# Patient Record
Sex: Female | Born: 1950 | Race: White | Hispanic: No | Smoking: Never smoker
Health system: Southern US, Community
[De-identification: ages and names within clinical notes are randomized; demographics above are authoritative.]

## PROBLEM LIST (undated history)

## (undated) DIAGNOSIS — C801 Malignant (primary) neoplasm, unspecified: Secondary | ICD-10-CM

## (undated) DIAGNOSIS — I10 Essential (primary) hypertension: Secondary | ICD-10-CM

## (undated) HISTORY — PX: QUADRICEPS TENDON REPAIR: SHX756

## (undated) HISTORY — PX: PARTIAL KNEE ARTHROPLASTY: SHX2174

## (undated) HISTORY — PX: ABDOMINAL HYSTERECTOMY: SHX81

---
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
## 1978-09-13 HISTORY — PX: AUGMENTATION MAMMAPLASTY: SUR837

## 2012-09-25 DIAGNOSIS — R928 Other abnormal and inconclusive findings on diagnostic imaging of breast: Secondary | ICD-10-CM | POA: Insufficient documentation

## 2015-08-18 DIAGNOSIS — Z808 Family history of malignant neoplasm of other organs or systems: Secondary | ICD-10-CM | POA: Insufficient documentation

## 2015-09-10 ENCOUNTER — Other Ambulatory Visit: Payer: Self-pay | Admitting: Family Medicine

## 2015-09-10 ENCOUNTER — Inpatient Hospital Stay
Admission: RE | Admit: 2015-09-10 | Discharge: 2015-09-10 | Disposition: A | Discharge status: Home / Self Care | Payer: Self-pay | Source: Ambulatory Visit | Attending: *Deleted | Admitting: *Deleted

## 2015-09-10 ENCOUNTER — Other Ambulatory Visit: Payer: Self-pay | Admitting: *Deleted

## 2015-09-10 DIAGNOSIS — Z1231 Encounter for screening mammogram for malignant neoplasm of breast: Secondary | ICD-10-CM

## 2015-09-10 DIAGNOSIS — Z9289 Personal history of other medical treatment: Secondary | ICD-10-CM

## 2015-09-12 ENCOUNTER — Other Ambulatory Visit: Payer: Self-pay | Admitting: Family Medicine

## 2015-09-12 ENCOUNTER — Ambulatory Visit
Admission: RE | Admit: 2015-09-12 | Discharge: 2015-09-12 | Disposition: A | Discharge status: Home / Self Care | Payer: Medicare Other | Source: Ambulatory Visit | Attending: Family Medicine | Admitting: Family Medicine

## 2015-09-12 DIAGNOSIS — Z1231 Encounter for screening mammogram for malignant neoplasm of breast: Secondary | ICD-10-CM | POA: Diagnosis present

## 2015-09-17 DIAGNOSIS — M545 Low back pain, unspecified: Secondary | ICD-10-CM | POA: Insufficient documentation

## 2016-08-30 ENCOUNTER — Other Ambulatory Visit: Payer: Self-pay | Admitting: Family Medicine

## 2016-08-30 DIAGNOSIS — Z780 Asymptomatic menopausal state: Secondary | ICD-10-CM

## 2016-08-30 DIAGNOSIS — Z1239 Encounter for other screening for malignant neoplasm of breast: Secondary | ICD-10-CM

## 2016-09-20 ENCOUNTER — Other Ambulatory Visit: Payer: Self-pay | Admitting: Family Medicine

## 2016-09-20 ENCOUNTER — Ambulatory Visit
Admission: RE | Admit: 2016-09-20 | Discharge: 2016-09-20 | Disposition: A | Discharge status: Home / Self Care | Payer: Medicare Other | Source: Ambulatory Visit | Attending: Family Medicine | Admitting: Family Medicine

## 2016-09-20 DIAGNOSIS — Z780 Asymptomatic menopausal state: Secondary | ICD-10-CM | POA: Insufficient documentation

## 2016-09-20 DIAGNOSIS — Z1239 Encounter for other screening for malignant neoplasm of breast: Secondary | ICD-10-CM

## 2016-09-20 DIAGNOSIS — Z1231 Encounter for screening mammogram for malignant neoplasm of breast: Secondary | ICD-10-CM | POA: Insufficient documentation

## 2016-09-20 HISTORY — DX: Malignant (primary) neoplasm, unspecified: C80.1

## 2016-11-22 DIAGNOSIS — M1711 Unilateral primary osteoarthritis, right knee: Secondary | ICD-10-CM | POA: Insufficient documentation

## 2017-09-19 ENCOUNTER — Other Ambulatory Visit: Payer: Self-pay | Admitting: Family Medicine

## 2017-09-19 DIAGNOSIS — Z1239 Encounter for other screening for malignant neoplasm of breast: Secondary | ICD-10-CM

## 2017-10-11 DIAGNOSIS — M5416 Radiculopathy, lumbar region: Secondary | ICD-10-CM | POA: Insufficient documentation

## 2017-10-25 ENCOUNTER — Ambulatory Visit: Payer: Medicare Other

## 2017-10-26 ENCOUNTER — Encounter (INDEPENDENT_AMBULATORY_CARE_PROVIDER_SITE_OTHER): Payer: Self-pay

## 2017-10-26 ENCOUNTER — Ambulatory Visit
Admission: RE | Admit: 2017-10-26 | Discharge: 2017-10-26 | Disposition: A | Discharge status: Home / Self Care | Payer: Medicare Other | Source: Ambulatory Visit | Attending: Family Medicine | Admitting: Family Medicine

## 2017-10-26 DIAGNOSIS — Z1239 Encounter for other screening for malignant neoplasm of breast: Secondary | ICD-10-CM

## 2017-10-26 DIAGNOSIS — Z1231 Encounter for screening mammogram for malignant neoplasm of breast: Secondary | ICD-10-CM | POA: Insufficient documentation

## 2018-03-13 ENCOUNTER — Other Ambulatory Visit: Payer: Self-pay

## 2018-03-13 ENCOUNTER — Encounter: Payer: Self-pay | Admitting: Emergency Medicine

## 2018-03-13 ENCOUNTER — Ambulatory Visit
Admission: EM | Admit: 2018-03-13 | Discharge: 2018-03-13 | Disposition: A | Discharge status: Home / Self Care | Payer: Medicare Other | Attending: Family Medicine | Admitting: Family Medicine

## 2018-03-13 DIAGNOSIS — Z90710 Acquired absence of both cervix and uterus: Secondary | ICD-10-CM | POA: Insufficient documentation

## 2018-03-13 DIAGNOSIS — I10 Essential (primary) hypertension: Secondary | ICD-10-CM | POA: Diagnosis not present

## 2018-03-13 DIAGNOSIS — J029 Acute pharyngitis, unspecified: Secondary | ICD-10-CM

## 2018-03-13 DIAGNOSIS — Z85828 Personal history of other malignant neoplasm of skin: Secondary | ICD-10-CM | POA: Diagnosis not present

## 2018-03-13 HISTORY — DX: Essential (primary) hypertension: I10

## 2018-03-13 LAB — RAPID STREP SCREEN (MED CTR MEBANE ONLY): Streptococcus, Group A Screen (Direct): NEGATIVE

## 2018-03-13 MED ORDER — AMOXICILLIN 500 MG PO CAPS: 500.0000 mg | ORAL_CAPSULE | Freq: Two times a day (BID) | ORAL | 0 refills | Status: AC

## 2018-03-13 MED ORDER — ACETAMINOPHEN 500 MG PO TABS: 500.0000 mg | ORAL_TABLET | Freq: Once | ORAL | Status: DC

## 2018-03-13 MED ORDER — ACETAMINOPHEN 500 MG PO TABS
1000.0000 mg | ORAL_TABLET | Freq: Once | ORAL | Status: AC
Start: 2018-03-13 — End: 2018-03-13
  Administered 2018-03-13: 1000 mg via ORAL

## 2018-03-13 NOTE — ED Triage Notes (Signed)
Patient c/o sore throat, HAs, chills, fever and bodyaches for the past 4 days.

## 2018-03-13 NOTE — Discharge Instructions (Signed)
Take medication as prescribed. Rest. Drink plenty of fluids.  ° °Follow up with your primary care physician this week as needed. Return to Urgent care for new or worsening concerns.  ° °

## 2018-03-13 NOTE — ED Provider Notes (Signed)
[facility] ____________________________________________  Time seen: Approximately 7:25 PM  I have reviewed the triage vital signs and the nursing notes.   HISTORY  Chief Complaint Sore Throat   HPI Emma Merritt is a 67 y.o. female presenting for evaluation of fever, chills, body aches and sore throat present for the last 3 to 4 days.  States initially chills and body aches with followed sore throat.  Denies cough or congestion.  Denies rash.  States sore throat has been moderate and still moderate currently.  Reports T-max of 102.5 which was this morning and she took Tylenol.  No Tylenol since early this morning.  Denies known sick contacts.  Reports that she has had some tick bites recently but denies any persisting rash or complaints from tick bites.  States low back knee and leg aches accompanying fevers.  Continues to eat and drink well.  Denies other aggravating or alleviating factors.  Reports otherwise feels well. Denies recent sickness. Denies recent antibiotic use.   Clarisse Gouge, MD: PCP   Past Medical History:  Diagnosis Date  . Cancer ([place])    skin ca  . Hypertension     There are no active problems to display for this patient.   Past Surgical History:  Procedure Laterality Date  . ABDOMINAL HYSTERECTOMY    . AUGMENTATION MAMMAPLASTY Bilateral 1980   breast implants     No current facility-administered medications for this encounter.   Current Outpatient Medications:  .  amoxicillin (AMOXIL) 500 MG capsule, Take 1 capsule (500 mg total) by mouth 2 (two) times daily for 10 days., Disp: 20 capsule, Rfl: 0 .  irbesartan (AVAPRO) 300 MG tablet, Take by mouth., Disp: , Rfl:   Allergies Patient has no known allergies.  Family History  Problem Relation Age of Onset  . Breast cancer Neg Hx     Social History Social History   Tobacco Use  . Smoking status: Never Smoker  . Smokeless tobacco: Never Used  Substance Use Topics  . Alcohol use:  Yes  . Drug use: Not on file    Review of Systems Constitutional: positive fever. Eyes: No visual changes. ENT: positive sore throat. Cardiovascular: Denies chest pain. Respiratory: Denies shortness of breath. Gastrointestinal: No abdominal pain.   Musculoskeletal: Negative for back pain. Skin: Negative for rash.  ____________________________________________   PHYSICAL EXAM:  VITAL SIGNS: ED Triage Vitals  Enc Vitals Group     BP 03/13/18 1756 (!) 142/77     Pulse Rate 03/13/18 1756 87     Resp 03/13/18 1756 14     Temp 03/13/18 1756 (!) 101.7 F (38.7 C)     Temp Source 03/13/18 1756 Oral     SpO2 03/13/18 1756 97 %     Weight 03/13/18 1753 145 lb (65.8 kg)     Height 03/13/18 1753 5' 8.5" (1.74 m)     Head Circumference --      Peak Flow --      Pain Score 03/13/18 1753 5     Pain Loc --      Pain Edu? --      Excl. in [place]? --     Constitutional: Alert and oriented. Well appearing and in no acute distress. Eyes: Conjunctivae are normal.  Head: Atraumatic. No sinus tenderness to palpation. No swelling. No erythema.  Ears: no erythema, normal TMs bilaterally.   Nose:No nasal congestion  Mouth/Throat: Mucous membranes are moist. Mild to moderate pharyngeal erythema.  Mild bilateral tonsillar swelling  with bilateral exudate.  No uvular shift or deviation noted. Neck: No stridor.  No cervical spine tenderness to palpation. Hematological/Lymphatic/Immunilogical: Anterior bilateral cervical lymphadenopathy. Cardiovascular: Normal rate, regular rhythm. Grossly normal heart sounds.  Good peripheral circulation. Respiratory: Normal respiratory effort.  No retractions. No wheezes, rales or rhonchi. Good air movement.  Musculoskeletal: Ambulatory with steady gait. No cervical, thoracic or lumbar tenderness to palpation. Neurologic:  Normal speech and language. No gait instability. Skin:  Skin appears warm, dry and intact. No rash noted. Psychiatric: Mood and affect are  normal. Speech and behavior are normal.   ___________________________________________   LABS (all labs ordered are listed, but only abnormal results are displayed)  Labs Reviewed  RAPID STREP SCREEN ([facility] ONLY)  CULTURE, GROUP A STREP [facility])   ____________________________________________  PROCEDURES Procedures   . INITIAL IMPRESSION / ASSESSMENT AND PLAN / ED COURSE  Pertinent labs & imaging results that were available during my care of the patient were reviewed by me and considered in my medical decision making (see chart for details).  Well-appearing patient.  A quick strep negative, will culture.  However suspect streptococcal pharyngitis.  Will empirically treat with oral amoxicillin and await strep culture.  Encourage rest, fluids, supportive care, Tylenol, ibuprofen and warm salt water rinses. Discussed indication, risks and benefits of medications with patient.  Discussed follow up with Primary care physician this week. Discussed follow up and return parameters including no resolution or any worsening concerns. Patient verbalized understanding and agreed to plan.   ____________________________________________   FINAL CLINICAL IMPRESSION(S) / ED DIAGNOSES  Final diagnoses:  Pharyngitis, unspecified etiology     ED Discharge Orders        Ordered    amoxicillin (AMOXIL) 500 MG capsule  2 times daily     03/13/18 1858       Note: This dictation was prepared with Dragon dictation along with smaller phrase technology. Any transcriptional errors that result from this process are unintentional.         Marylene Land, NP 03/13/18 1929

## 2018-03-16 LAB — CULTURE, GROUP A STREP (THRC)

## 2019-03-13 DIAGNOSIS — S76109A Unspecified injury of unspecified quadriceps muscle, fascia and tendon, initial encounter: Secondary | ICD-10-CM | POA: Insufficient documentation

## 2019-05-01 DIAGNOSIS — N952 Postmenopausal atrophic vaginitis: Secondary | ICD-10-CM | POA: Insufficient documentation

## 2019-05-01 DIAGNOSIS — R87810 Cervical high risk human papillomavirus (HPV) DNA test positive: Secondary | ICD-10-CM | POA: Insufficient documentation

## 2019-05-01 DIAGNOSIS — R87612 Low grade squamous intraepithelial lesion on cytologic smear of cervix (LGSIL): Secondary | ICD-10-CM | POA: Insufficient documentation

## 2019-07-03 ENCOUNTER — Other Ambulatory Visit: Payer: Self-pay | Admitting: Family Medicine

## 2019-07-03 DIAGNOSIS — Z1231 Encounter for screening mammogram for malignant neoplasm of breast: Secondary | ICD-10-CM

## 2019-09-20 ENCOUNTER — Other Ambulatory Visit: Payer: Self-pay

## 2019-09-20 ENCOUNTER — Ambulatory Visit
Admission: RE | Admit: 2019-09-20 | Discharge: 2019-09-20 | Disposition: A | Discharge status: Home / Self Care | Payer: Medicare Other | Source: Ambulatory Visit | Attending: Family Medicine | Admitting: Family Medicine

## 2019-09-20 ENCOUNTER — Encounter (INDEPENDENT_AMBULATORY_CARE_PROVIDER_SITE_OTHER): Payer: Self-pay

## 2019-09-20 DIAGNOSIS — Z1231 Encounter for screening mammogram for malignant neoplasm of breast: Secondary | ICD-10-CM | POA: Diagnosis not present

## 2019-09-24 ENCOUNTER — Other Ambulatory Visit: Payer: Self-pay | Admitting: Family Medicine

## 2019-09-24 DIAGNOSIS — R928 Other abnormal and inconclusive findings on diagnostic imaging of breast: Secondary | ICD-10-CM

## 2019-10-02 ENCOUNTER — Ambulatory Visit
Admission: RE | Admit: 2019-10-02 | Discharge: 2019-10-02 | Disposition: A | Discharge status: Home / Self Care | Payer: Medicare Other | Source: Ambulatory Visit | Attending: Family Medicine | Admitting: Family Medicine

## 2019-10-02 DIAGNOSIS — R928 Other abnormal and inconclusive findings on diagnostic imaging of breast: Secondary | ICD-10-CM

## 2019-10-08 DIAGNOSIS — B001 Herpesviral vesicular dermatitis: Secondary | ICD-10-CM | POA: Insufficient documentation

## 2019-10-08 DIAGNOSIS — G5793 Unspecified mononeuropathy of bilateral lower limbs: Secondary | ICD-10-CM | POA: Insufficient documentation

## 2019-10-08 DIAGNOSIS — F5104 Psychophysiologic insomnia: Secondary | ICD-10-CM | POA: Insufficient documentation

## 2019-10-08 DIAGNOSIS — R251 Tremor, unspecified: Secondary | ICD-10-CM | POA: Insufficient documentation

## 2020-04-07 DIAGNOSIS — E538 Deficiency of other specified B group vitamins: Secondary | ICD-10-CM | POA: Insufficient documentation

## 2020-05-14 DIAGNOSIS — E7800 Pure hypercholesterolemia, unspecified: Secondary | ICD-10-CM | POA: Insufficient documentation

## 2020-05-14 DIAGNOSIS — F419 Anxiety disorder, unspecified: Secondary | ICD-10-CM | POA: Insufficient documentation

## 2020-07-08 DIAGNOSIS — M75121 Complete rotator cuff tear or rupture of right shoulder, not specified as traumatic: Secondary | ICD-10-CM | POA: Insufficient documentation

## 2020-09-12 HISTORY — PX: TOTAL KNEE ARTHROPLASTY: SHX125

## 2020-10-24 ENCOUNTER — Other Ambulatory Visit: Payer: Self-pay | Admitting: Family Medicine

## 2020-10-24 DIAGNOSIS — Z1231 Encounter for screening mammogram for malignant neoplasm of breast: Secondary | ICD-10-CM

## 2020-10-27 DIAGNOSIS — S80811A Abrasion, right lower leg, initial encounter: Secondary | ICD-10-CM | POA: Insufficient documentation

## 2020-11-14 ENCOUNTER — Other Ambulatory Visit: Payer: Self-pay

## 2020-11-14 ENCOUNTER — Ambulatory Visit
Admission: RE | Admit: 2020-11-14 | Discharge: 2020-11-14 | Disposition: A | Discharge status: Home / Self Care | Payer: Medicare Other | Source: Ambulatory Visit | Attending: Family Medicine | Admitting: Family Medicine

## 2020-11-14 DIAGNOSIS — Z1231 Encounter for screening mammogram for malignant neoplasm of breast: Secondary | ICD-10-CM | POA: Insufficient documentation

## 2020-11-30 ENCOUNTER — Other Ambulatory Visit: Payer: Self-pay

## 2020-11-30 ENCOUNTER — Ambulatory Visit
Admission: EM | Admit: 2020-11-30 | Discharge: 2020-11-30 | Disposition: A | Discharge status: Home / Self Care | Payer: Medicare Other | Attending: Sports Medicine | Admitting: Sports Medicine

## 2020-11-30 DIAGNOSIS — S9032XA Contusion of left foot, initial encounter: Secondary | ICD-10-CM

## 2020-11-30 DIAGNOSIS — M79672 Pain in left foot: Secondary | ICD-10-CM | POA: Diagnosis not present

## 2020-11-30 DIAGNOSIS — L03119 Cellulitis of unspecified part of limb: Secondary | ICD-10-CM

## 2020-11-30 MED ORDER — CEPHALEXIN 500 MG PO CAPS: 500.0000 mg | ORAL_CAPSULE | Freq: Four times a day (QID) | ORAL | 0 refills | Status: DC

## 2020-11-30 NOTE — Discharge Instructions (Signed)
Your exam is consistent with a foot contusion with a hematoma.  There is a possibility of infection so I will cover you with Keflex 4 times a day for a week. I provided educational handouts.  I want you to heat the area 20 minutes every 2 hours while awake to help thin any potential hematoma and congealed blood. If your symptoms were to worsen in any way, please see your primary care provider or go to the emergency room. These hematomas can sometimes persist for several months or longer. Over-the-counter meds as needed, Tylenol or Motrin for any discomfort. Follow-up here as needed.

## 2020-11-30 NOTE — ED Triage Notes (Addendum)
Pt states she injured her foot 2 weeks ago when bumping it into a chair. Went to [facility] and had xrays and nothing broken. Still with red swollen area over top left foot. No pain unless pressure and then it "feels like its on fire."

## 2020-12-05 NOTE — ED Provider Notes (Signed)
[facility]    CSN: [id number] Arrival date & time: 11/30/20  5053      History   Chief Complaint Chief Complaint  Patient presents with  . Foot Injury    HPI Emma Merritt is a 70 y.o. female.   Patient is a pleasant 70 year old female who presents for evaluation of the above issues.  Patient reports injuring her left foot on 11/17/2020.  She hit the lateral aspect of her foot into a chair at home.  She got swelling and ecchymosis.  Was seen at [facility] in [place] and x-rays were done that were negative.  Ordered care.  She has been soaking in Epson salts.  No fever shakes chills.  He does say that it burns and is concerned that she may be developing an infection.  She comes in today for an initial urgent care evaluation.  No red flag signs or symptoms elicited on history.     Past Medical History:  Diagnosis Date  . Cancer ([place])    skin ca  . Hypertension     There are no problems to display for this patient.   Past Surgical History:  Procedure Laterality Date  . ABDOMINAL HYSTERECTOMY    . AUGMENTATION MAMMAPLASTY Bilateral 1980   breast implants    OB History   No obstetric history on file.      Home Medications    Prior to Admission medications   Medication Sig Start Date End Date Taking? Authorizing Provider  cephALEXin (KEFLEX) 500 MG capsule Take 1 capsule (500 mg total) by mouth 4 (four) times daily. 11/30/20  Yes Verda Cumins, MD  hydrochlorothiazide (MICROZIDE) 12.5 MG capsule hydrochlorothiazide 12.5 mg caps    [provider]  irbesartan (AVAPRO) 300 MG tablet Take by mouth.    [provider]    Family History Family History  Problem Relation Age of Onset  . Breast cancer Neg Hx     Social History Social History   Tobacco Use  . Smoking status: Never Smoker  . Smokeless tobacco: Never Used  Vaping Use  . Vaping Use: Never used  Substance Use Topics  . Alcohol use: Yes    Comment: social  . Drug  use: Not Currently     Allergies   Morphine   Review of Systems Review of Systems  Constitutional: Positive for activity change. Negative for appetite change, chills, diaphoresis, fatigue and fever.  HENT: Negative.  Negative for congestion, ear pain, sinus pain and sore throat.   Eyes: Negative.  Negative for pain.  Respiratory: Negative.  Negative for cough, choking, chest tightness, shortness of breath, wheezing and stridor.   Cardiovascular: Negative.  Negative for chest pain and palpitations.  Gastrointestinal: Negative for abdominal pain, constipation, diarrhea, nausea and vomiting.  Genitourinary: Negative.  Negative for dysuria.  Musculoskeletal: Positive for arthralgias and gait problem. Negative for back pain, myalgias and neck pain.  Skin: Positive for color change.  Neurological: Negative for dizziness, light-headedness and headaches.  Hematological: Bruises/bleeds easily.  All other systems reviewed and are negative.    Physical Exam Triage Vital Signs ED Triage Vitals  Enc Vitals Group     BP 11/30/20 0935 132/78     Pulse Rate 11/30/20 0935 86     Resp 11/30/20 0935 15     Temp 11/30/20 0935 98 F (36.7 C)     Temp Source 11/30/20 0935 Oral     SpO2 11/30/20 0935 100 %     Weight 11/30/20 0933  145 lb (65.8 kg)     Height 11/30/20 0933 5\' 8"  (1.727 m)     Head Circumference --      Peak Flow --      Pain Score 11/30/20 0932 0     Pain Loc --      Pain Edu? --      Excl. in [place]? --    No data found.  Updated Vital Signs BP 132/78 (BP Location: Left Arm)   Pulse 86   Temp 98 F (36.7 C) (Oral)   Resp 15   Ht 5\' 8"  (1.727 m)   Wt 65.8 kg   SpO2 100%   BMI 22.05 kg/m   Visual Acuity Right Eye Distance:   Left Eye Distance:   Bilateral Distance:    Right Eye Near:   Left Eye Near:    Bilateral Near:     Physical Exam Vitals and nursing note reviewed.  Constitutional:      General: She is not in acute distress.    Appearance: Normal  appearance. She is not ill-appearing, toxic-appearing or diaphoretic.  HENT:     Head: Normocephalic and atraumatic.  Eyes:     Pupils: Pupils are equal, round, and reactive to light.  Cardiovascular:     Rate and Rhythm: Normal rate and regular rhythm.     Pulses: Normal pulses.     Heart sounds: Normal heart sounds.  Pulmonary:     Effort: Pulmonary effort is normal.     Breath sounds: Normal breath sounds.  Musculoskeletal:     Cervical back: Normal range of motion and neck supple.  Skin:    General: Skin is warm.     Capillary Refill: Capillary refill takes less than 2 seconds.     Findings: Bruising and erythema present.     Comments: Soft tissue swelling is noted over the lateral aspect of the foot.  There is associated erythema and warmth.  Potential infection present.  Consistent with cellulitis versus congealed blood from a significant hematoma.  She is tender to palpation.  There is no midfoot instability.  She has good active range of motion.  No evidence of any tendon retraction.  Remainder of foot and ankle exam is within normal limits.  Neurological:     General: No focal deficit present.     Mental Status: She is alert and oriented to person, place, and time.     Sensory: No sensory deficit.     Gait: Gait abnormal (mild limp).      UC Treatments / Results  Labs (all labs ordered are listed, but only abnormal results are displayed) Labs Reviewed - No data to display  EKG   Radiology No results found.  Procedures Procedures (including critical care time)  Medications Ordered in UC Medications - No data to display  Initial Impression / Assessment and Plan / UC Course  I have reviewed the triage vital signs and the nursing notes.  Pertinent labs & imaging results that were available during my care of the patient were reviewed by me and considered in my medical decision making (see chart for details).  Clinical impression: Contusion to the left lateral  aspect of the foot.  She has persistent pain.  X-rays done in orthopedics were negative for fracture.  Seems consistent with a hematoma with congealed blood.  Can fully rule out some mild cellulitis.  We will treat accordingly.  Treatment plan: 1.  Findings and treatment plan were discussed in detail with  the patient.  Patient was in agreement. 2.  I felt that this was probably just more congealed blood from the hematoma and may take some time.  That said it was not reasonable given the fact that she does have some warmth and erythema to treat her with an antibiotic for mild cellulitis.  Prescribed Keflex 4 times daily for a week. 3.  Recommended that she put hot compresses on that area and to try to thin some of the blood in the lower body did not take care of it.  Certainly if the redness and warmth was to worsen with that I want her to discontinue that. 4.  Educational handouts were provided. 5.  If symptoms do not resolve I want her to see her primary care provider or come here.  Certainly if they worsen then I want her to go to the ER. 6.  Supportive care, over-the-counter meds as needed, Tylenol or Motrin for any fever or discomfort. 7.  Follow-up as needed.    Final Clinical Impressions(s) / UC Diagnoses   Final diagnoses:  Contusion of left foot, initial encounter  Foot pain, left  Hematoma of left foot  Cellulitis of foot     Discharge Instructions     Your exam is consistent with a foot contusion with a hematoma.  There is a possibility of infection so I will cover you with Keflex 4 times a day for a week. I provided educational handouts.  I want you to heat the area 20 minutes every 2 hours while awake to help thin any potential hematoma and congealed blood. If your symptoms were to worsen in any way, please see your primary care provider or go to the emergency room. These hematomas can sometimes persist for several months or longer. Over-the-counter meds as needed, Tylenol or  Motrin for any discomfort. Follow-up here as needed.    ED Prescriptions    Medication Sig Dispense Auth. Provider   cephALEXin (KEFLEX) 500 MG capsule Take 1 capsule (500 mg total) by mouth 4 (four) times daily. 28 capsule Verda Cumins, MD     PDMP not reviewed this encounter.   Verda Cumins, MD 12/05/20 2022

## 2021-01-21 DIAGNOSIS — Z85828 Personal history of other malignant neoplasm of skin: Secondary | ICD-10-CM | POA: Insufficient documentation

## 2021-01-23 DIAGNOSIS — R52 Pain, unspecified: Secondary | ICD-10-CM | POA: Insufficient documentation

## 2021-01-23 DIAGNOSIS — M542 Cervicalgia: Secondary | ICD-10-CM | POA: Insufficient documentation

## 2021-01-23 DIAGNOSIS — R2689 Other abnormalities of gait and mobility: Secondary | ICD-10-CM | POA: Insufficient documentation

## 2021-04-14 ENCOUNTER — Encounter: Payer: Self-pay | Admitting: *Deleted

## 2021-04-15 ENCOUNTER — Telehealth (INDEPENDENT_AMBULATORY_CARE_PROVIDER_SITE_OTHER): Payer: Self-pay | Admitting: Gastroenterology

## 2021-04-15 DIAGNOSIS — S76019A Strain of muscle, fascia and tendon of unspecified hip, initial encounter: Secondary | ICD-10-CM | POA: Insufficient documentation

## 2021-04-15 DIAGNOSIS — Z1211 Encounter for screening for malignant neoplasm of colon: Secondary | ICD-10-CM

## 2021-04-15 DIAGNOSIS — I10 Essential (primary) hypertension: Secondary | ICD-10-CM | POA: Insufficient documentation

## 2021-04-15 DIAGNOSIS — M6281 Muscle weakness (generalized): Secondary | ICD-10-CM | POA: Insufficient documentation

## 2021-04-15 DIAGNOSIS — C4491 Basal cell carcinoma of skin, unspecified: Secondary | ICD-10-CM | POA: Insufficient documentation

## 2021-04-15 DIAGNOSIS — E785 Hyperlipidemia, unspecified: Secondary | ICD-10-CM | POA: Insufficient documentation

## 2021-04-15 MED ORDER — CLENPIQ 10-3.5-12 MG-GM -GM/160ML PO SOLN: 1.0000 | Freq: Once | ORAL | 0 refills | Status: AC

## 2021-04-15 NOTE — Progress Notes (Signed)
Gastroenterology Pre-Procedure Review  Request Date: 05/12/21  Requesting Physician: Dr. Vicente Males  PATIENT REVIEW QUESTIONS: The patient responded to the following health history questions as indicated:    1. Are you having any GI issues? no 2. Do you have a personal history of Polyps?  Patient states she had a colonoscopy last month but they had to stop in the middle of procedure because patient could feel everything. 3. Do you have a family history of Colon Cancer or Polyps? no 4. Diabetes Mellitus? no 5. Joint replacements in the past 12 months?yes (Knee 2021) 6. Major health problems in the past 3 months?no 7. Any artificial heart valves, MVP, or defibrillator?no    MEDICATIONS & ALLERGIES:    Patient reports the following regarding taking any anticoagulation/antiplatelet therapy:   Plavix, Coumadin, Eliquis, Xarelto, Lovenox, Pradaxa, Brilinta, or Effient? no Aspirin? yes (81 mg)  Patient confirms/reports the following medications:  Current Outpatient Medications  Medication Sig Dispense Refill   cephALEXin (KEFLEX) 500 MG capsule Take 1 capsule (500 mg total) by mouth 4 (four) times daily. 28 capsule 0   hydrochlorothiazide (MICROZIDE) 12.5 MG capsule hydrochlorothiazide 12.5 mg caps     irbesartan (AVAPRO) 300 MG tablet Take by mouth.     No current facility-administered medications for this visit.    Patient confirms/reports the following allergies:  Allergies  Allergen Reactions   Morphine Nausea And Vomiting and Nausea Only    No orders of the defined types were placed in this encounter.   AUTHORIZATION INFORMATION Primary Insurance: 1D#: Group #:  Secondary Insurance: 1D#: Group #:  SCHEDULE INFORMATION: Date: 05/12/21 Time: Location: [place]

## 2021-05-12 ENCOUNTER — Ambulatory Visit
Admission: RE | Admit: 2021-05-12 | Discharge: 2021-05-12 | Disposition: A | Discharge status: Home / Self Care | Payer: Medicare Other | Attending: Gastroenterology | Admitting: Gastroenterology

## 2021-05-12 ENCOUNTER — Encounter: Payer: Self-pay | Admitting: Gastroenterology

## 2021-05-12 ENCOUNTER — Ambulatory Visit: Payer: Medicare Other | Admitting: Anesthesiology

## 2021-05-12 ENCOUNTER — Encounter
Admission: RE | Disposition: A | Discharge status: Home / Self Care | Payer: Self-pay | Source: Home / Self Care | Attending: Gastroenterology

## 2021-05-12 ENCOUNTER — Other Ambulatory Visit: Payer: Self-pay

## 2021-05-12 DIAGNOSIS — Z885 Allergy status to narcotic agent status: Secondary | ICD-10-CM | POA: Diagnosis not present

## 2021-05-12 DIAGNOSIS — Z9882 Breast implant status: Secondary | ICD-10-CM | POA: Diagnosis not present

## 2021-05-12 DIAGNOSIS — K644 Residual hemorrhoidal skin tags: Secondary | ICD-10-CM | POA: Insufficient documentation

## 2021-05-12 DIAGNOSIS — Z85828 Personal history of other malignant neoplasm of skin: Secondary | ICD-10-CM | POA: Diagnosis not present

## 2021-05-12 DIAGNOSIS — Z96652 Presence of left artificial knee joint: Secondary | ICD-10-CM | POA: Insufficient documentation

## 2021-05-12 DIAGNOSIS — Z79899 Other long term (current) drug therapy: Secondary | ICD-10-CM | POA: Insufficient documentation

## 2021-05-12 DIAGNOSIS — Z1211 Encounter for screening for malignant neoplasm of colon: Secondary | ICD-10-CM

## 2021-05-12 DIAGNOSIS — I10 Essential (primary) hypertension: Secondary | ICD-10-CM | POA: Insufficient documentation

## 2021-05-12 DIAGNOSIS — Z90710 Acquired absence of both cervix and uterus: Secondary | ICD-10-CM | POA: Insufficient documentation

## 2021-05-12 HISTORY — PX: COLONOSCOPY WITH PROPOFOL: SHX5780

## 2021-05-12 SURGERY — COLONOSCOPY WITH PROPOFOL
Anesthesia: General

## 2021-05-12 MED ORDER — PROPOFOL 500 MG/50ML IV EMUL
INTRAVENOUS | Status: DC | PRN
  Administered 2021-05-12: 150 ug/kg/min via INTRAVENOUS

## 2021-05-12 MED ORDER — SODIUM CHLORIDE 0.9 % IV SOLN: INTRAVENOUS | Status: DC

## 2021-05-12 MED ORDER — LIDOCAINE HCL (PF) 2 % IJ SOLN
INTRAMUSCULAR | Status: AC
  Filled 2021-05-12: qty 5

## 2021-05-12 MED ORDER — PROPOFOL 500 MG/50ML IV EMUL
INTRAVENOUS | Status: AC
  Filled 2021-05-12: qty 50

## 2021-05-12 MED ORDER — PROPOFOL 10 MG/ML IV BOLUS
INTRAVENOUS | Status: DC | PRN
  Administered 2021-05-12: 50 mg via INTRAVENOUS
  Administered 2021-05-12: 70 mg via INTRAVENOUS
  Administered 2021-05-12: 30 mg via INTRAVENOUS
  Administered 2021-05-12: 50 mg via INTRAVENOUS

## 2021-05-12 MED ORDER — PHENYLEPHRINE HCL (PRESSORS) 10 MG/ML IV SOLN
INTRAVENOUS | Status: AC
  Filled 2021-05-12: qty 1

## 2021-05-12 MED ORDER — LIDOCAINE 2% (20 MG/ML) 5 ML SYRINGE
INTRAMUSCULAR | Status: DC | PRN
  Administered 2021-05-12: 50 mg via INTRAVENOUS

## 2021-05-12 NOTE — Anesthesia Postprocedure Evaluation (Signed)
Anesthesia Post Note  Patient: Emma Merritt  Procedure(s) Performed: COLONOSCOPY WITH PROPOFOL  Patient location during evaluation: PACU Anesthesia Type: General Level of consciousness: awake and alert Pain management: pain level controlled Vital Signs Assessment: post-procedure vital signs reviewed and stable Respiratory status: spontaneous breathing, nonlabored ventilation, respiratory function stable and patient connected to nasal cannula oxygen Cardiovascular status: blood pressure returned to baseline and stable Postop Assessment: no apparent nausea or vomiting Anesthetic complications: no   No notable events documented.   Last Vitals:  Vitals:   05/12/21 1105 05/12/21 1115  BP: 116/71 127/65  Pulse:    Resp:    Temp:    SpO2:      Last Pain:  Vitals:   05/12/21 1115  TempSrc:   PainSc: 0-No pain                 Molli Barrows

## 2021-05-12 NOTE — H&P (Signed)
Emma Darby, MD [address]  [place]  [place], [place] [address]  Main: [phone]  Fax: [phone] Pager: [phone]  Primary Care Physician:  Ashley Jacobs, MD Primary Gastroenterologist:  Dr. Cephas Merritt  Pre-Procedure History & Physical: HPI:  Emma Merritt is a 70 y.o. female is here for an colonoscopy.   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 Authorizing Provider  Eszopiclone 3 MG TABS Take 3 mg by mouth at bedtime. 02/18/17  Yes [provider]  hydrochlorothiazide (MICROZIDE) 12.5 MG capsule hydrochlorothiazide 12.5 mg caps   Yes [provider]  irbesartan (AVAPRO) 300 MG tablet Take by mouth.   Yes [provider]  aspirin 81 MG EC tablet Take 81 mg by mouth in the morning and at bedtime. Patient not taking: Reported on 05/12/2021    [provider]  benzonatate (TESSALON) 100 MG capsule Take 100 mg by mouth 3 (three) times daily as needed. Patient not taking: Reported on 05/12/2021    [provider]  cephALEXin (KEFLEX) 500 MG capsule Take 1 capsule (500 mg total) by mouth 4 (four) times daily. Patient not taking: Reported on 05/12/2021 11/30/20   Verda Cumins, MD  diazepam (VALIUM) 10 MG tablet Take 10 mg by mouth as needed. Patient not taking: Reported on 05/12/2021 12/31/20   [provider]  diclofenac Sodium (VOLTAREN) 1 % GEL Voltaren 1 % topical gel  apply 2 grams to affected area four times a day    [provider]    Allergies as of 04/15/2021 - Review Complete 04/15/2021  Allergen Reaction Noted   Morphine Nausea  And Vomiting and Nausea Only 08/18/2015    Family History  Problem Relation Age of Onset   Breast cancer Neg Hx     Social History   Socioeconomic History   Marital status: Unknown    Spouse name: Not on file   Number of children: Not on file   Years of education: Not on file   Highest education level: Not on file  Occupational History   Not on file  Tobacco Use   Smoking status: Never   Smokeless tobacco: Never  Vaping Use   Vaping Use: Never used  Substance and Sexual Activity   Alcohol use: Yes    Comment: social   Drug use: Not Currently   Sexual activity: Not on file  Other Topics Concern   Not on file  Social History Narrative   Not on file   Social Determinants of Health   Financial Resource Strain: Not on file  Food Insecurity: Not on file  Transportation Needs: Not on file  Physical Activity: Not on file  Stress: Not on file  Social Connections: Not on file  Intimate Partner Violence: Not on file    Review of Systems: See HPI, otherwise negative ROS  Physical Exam: BP (!) 142/94   Pulse 63   Temp (!) 97 F (36.1 C) (Temporal)   Resp 16   Ht '5\' 8"'$  (1.727 m)   Wt 65.3 kg   SpO2  100%   BMI 21.90 kg/m  General:   Alert,  pleasant and cooperative in NAD Head:  Normocephalic and atraumatic. Neck:  Supple; no masses or thyromegaly. Lungs:  Clear throughout to auscultation.    Heart:  Regular rate and rhythm. Abdomen:  Soft, nontender and nondistended. Normal bowel sounds, without guarding, and without rebound.   Neurologic:  Alert and  oriented x4;  grossly normal neurologically.  Impression/Plan: Emma Merritt is here for an colonoscopy to be performed for colon cancer screening  Risks, benefits, limitations, and alternatives regarding  colonoscopy have been reviewed with the patient.  Questions have been answered.  All parties agreeable.   Sherri Sear, MD  05/12/2021, 9:49 AM

## 2021-05-12 NOTE — Transfer of Care (Signed)
Immediate Anesthesia Transfer of Care Note  Patient: Emma Merritt  Procedure(s) Performed: COLONOSCOPY WITH PROPOFOL  Patient Location: Endoscopy Unit  Anesthesia Type:General  Level of Consciousness: awake  Airway & Oxygen Therapy: Patient Spontanous Breathing  Post-op Assessment: Report given to RN and Post -op Vital signs reviewed and stable  Post vital signs: Reviewed and stable  Last Vitals:  Vitals Value Taken Time  BP 100/62 05/12/21 1056  Temp 36.4 C 05/12/21 1055  Pulse 64 05/12/21 1057  Resp 15 05/12/21 1057  SpO2 97 % 05/12/21 1057  Vitals shown include unvalidated device data.  Last Pain:  Vitals:   05/12/21 1055  TempSrc: Temporal  PainSc: 0-No pain         Complications: No notable events documented.

## 2021-05-12 NOTE — Anesthesia Preprocedure Evaluation (Signed)
Anesthesia Evaluation  Patient identified by MRN, date of birth, ID band Patient awake    Reviewed: Allergy & Precautions, H&P , NPO status , Patient's Chart, lab work & pertinent test results, reviewed documented beta blocker date and time   Airway Mallampati: II   Neck ROM: full    Dental  (+) Poor Dentition   Pulmonary neg pulmonary ROS,    Pulmonary exam normal        Cardiovascular Exercise Tolerance: Good hypertension, On Medications negative cardio ROS Normal cardiovascular exam Rhythm:regular Rate:Normal     Neuro/Psych Anxiety  Neuromuscular disease negative psych ROS   GI/Hepatic negative GI ROS, Neg liver ROS,   Endo/Other  negative endocrine ROS  Renal/GU negative Renal ROS  negative genitourinary   Musculoskeletal   Abdominal   Peds  Hematology negative hematology ROS (+)   Anesthesia Other Findings Past Medical History: No date: Cancer ([place])     Comment:  skin ca No date: Hypertension Past Surgical History: No date: ABDOMINAL HYSTERECTOMY 1980: AUGMENTATION MAMMAPLASTY; Bilateral     Comment:  breast implants No date: PARTIAL KNEE ARTHROPLASTY No date: QUADRICEPS TENDON REPAIR 09/12/2020: TOTAL KNEE ARTHROPLASTY; Left BMI    Body Mass Index: 21.90 kg/m     Reproductive/Obstetrics negative OB ROS                             Anesthesia Physical Anesthesia Plan  ASA: 2  Anesthesia Plan: General   Post-op Pain Management:    Induction:   PONV Risk Score and Plan:   Airway Management Planned:   Additional Equipment:   Intra-op Plan:   Post-operative Plan:   Informed Consent: I have reviewed the patients History and Physical, chart, labs and discussed the procedure including the risks, benefits and alternatives for the proposed anesthesia with the patient or authorized representative who has indicated his/her understanding and acceptance.     Dental  Advisory Given  Plan Discussed with: CRNA  Anesthesia Plan Comments:         Anesthesia Quick Evaluation

## 2021-05-12 NOTE — Op Note (Signed)
[facility] Gastroenterology Patient Name: Emma Merritt Procedure Date: 05/12/2021 10:23 AM MRN: [id number] Account #: [id number] Date of Birth: 1950-12-15 Admit Type: Outpatient Age: 70 Room: [facility] ENDO ROOM 2 Gender: Female Note Status: Finalized Procedure:             Colonoscopy Indications:           Screening for colorectal malignant neoplasm Providers:             Lin Landsman MD, MD Medicines:             General Anesthesia Complications:         No immediate complications. Estimated blood loss: None. Procedure:             Pre-Anesthesia Assessment:                        - Prior to the procedure, a History and Physical was                         performed, and patient medications and allergies were                         reviewed. The patient is competent. The risks and                         benefits of the procedure and the sedation options and                         risks were discussed with the patient. All questions                         were answered and informed consent was obtained.                         Patient identification and proposed procedure were                         verified by the physician, the nurse, the                         anesthesiologist, the anesthetist and the technician                         in the pre-procedure area in the procedure room in the                         endoscopy suite. Mental Status Examination: alert and                         oriented. Airway Examination: normal oropharyngeal                         airway and neck mobility. Respiratory Examination:                         clear to auscultation. CV Examination: normal.                         Prophylactic Antibiotics: The patient does not require  prophylactic antibiotics. Prior Anticoagulants: The                         patient has taken no previous anticoagulant or                         antiplatelet agents. ASA  Grade Assessment: II - A                         patient with mild systemic disease. After reviewing                         the risks and benefits, the patient was deemed in                         satisfactory condition to undergo the procedure. The                         anesthesia plan was to use general anesthesia.                         Immediately prior to administration of medications,                         the patient was re-assessed for adequacy to receive                         sedatives. The heart rate, respiratory rate, oxygen                         saturations, blood pressure, adequacy of pulmonary                         ventilation, and response to care were monitored                         throughout the procedure. The physical status of the                         patient was re-assessed after the procedure.                        After obtaining informed consent, the colonoscope was                         passed under direct vision. Throughout the procedure,                         the patient's blood pressure, pulse, and oxygen                         saturations were monitored continuously. The                         Colonoscope was introduced through the anus and                         advanced to the the cecum, identified by appendiceal  orifice and ileocecal valve. The colonoscopy was                         performed without difficulty. The patient tolerated                         the procedure well. The quality of the bowel                         preparation was evaluated using the BBPS [facility] Bowel                         Preparation Scale) with scores of: Right Colon = 3,                         Transverse Colon = 3 and Left Colon = 3 (entire mucosa                         seen well with no residual staining, small fragments                         of stool or opaque liquid). The total BBPS score                         equals  9. Findings:      The perianal and digital rectal examinations were normal. Pertinent       negatives include normal sphincter tone and no palpable rectal lesions.      The entire examined colon appeared normal.      Non-bleeding external hemorrhoids were found during retroflexion. The       hemorrhoids were large. Impression:            - The entire examined colon is normal.                        - Non-bleeding external hemorrhoids.                        - No specimens collected. Recommendation:        - Discharge patient to home (with escort).                        - Resume previous diet today.                        - Continue present medications.                        - Repeat colonoscopy in 10 years for screening                         purposes. Procedure Code(s):     --- Professional ---                        [id number], Colorectal cancer screening; colonoscopy on                         individual not meeting criteria for high risk Diagnosis Code(s):     ---  Professional ---                        Z12.11, Encounter for screening for malignant neoplasm                         of colon                        K64.4, Residual hemorrhoidal skin tags CPT copyright 2019 [employer]. All rights reserved. The codes documented in this report are preliminary and upon coder review may  be revised to meet current compliance requirements. Dr. Ulyess Mort Lin Landsman MD, MD 05/12/2021 10:53:43 AM This report has been signed electronically. Number of Addenda: 0 Note Initiated On: 05/12/2021 10:23 AM Scope Withdrawal Time: 0 hours 6 minutes 50 seconds  Total Procedure Duration: 0 hours 12 minutes 32 seconds  Estimated Blood Loss:  Estimated blood loss: none.      [facility]

## 2021-05-13 ENCOUNTER — Encounter: Payer: Self-pay | Admitting: Gastroenterology

## 2021-12-15 ENCOUNTER — Other Ambulatory Visit: Payer: Self-pay | Admitting: Family Medicine

## 2021-12-15 DIAGNOSIS — Z1231 Encounter for screening mammogram for malignant neoplasm of breast: Secondary | ICD-10-CM

## 2022-01-19 ENCOUNTER — Ambulatory Visit
Admission: RE | Admit: 2022-01-19 | Discharge: 2022-01-19 | Disposition: A | Discharge status: Home / Self Care | Payer: Medicare Other | Source: Ambulatory Visit | Attending: Family Medicine | Admitting: Family Medicine

## 2022-01-19 DIAGNOSIS — Z1231 Encounter for screening mammogram for malignant neoplasm of breast: Secondary | ICD-10-CM | POA: Diagnosis present

## 2022-01-21 ENCOUNTER — Other Ambulatory Visit: Payer: Self-pay | Admitting: Orthopaedic Surgery

## 2022-01-21 DIAGNOSIS — T84033A Mechanical loosening of internal left knee prosthetic joint, initial encounter: Secondary | ICD-10-CM

## 2022-02-01 ENCOUNTER — Encounter
Admission: RE | Admit: 2022-02-01 | Discharge: 2022-02-01 | Disposition: A | Discharge status: Home / Self Care | Payer: Medicare Other | Source: Ambulatory Visit | Attending: Orthopaedic Surgery | Admitting: Orthopaedic Surgery

## 2022-02-01 ENCOUNTER — Encounter
Admission: RE | Admit: 2022-02-01 | Discharge: 2022-02-01 | Disposition: A | Discharge status: Home / Self Care | Payer: Medicare Other | Source: Ambulatory Visit | Attending: Family Medicine | Admitting: Family Medicine

## 2022-02-01 DIAGNOSIS — T84033A Mechanical loosening of internal left knee prosthetic joint, initial encounter: Secondary | ICD-10-CM | POA: Diagnosis present

## 2022-02-01 MED ORDER — TECHNETIUM TC 99M MEDRONATE IV KIT
20.0000 | PACK | Freq: Once | INTRAVENOUS | Status: AC | PRN
  Administered 2022-02-01: 20.46 via INTRAVENOUS

## 2023-01-17 ENCOUNTER — Other Ambulatory Visit: Payer: Self-pay | Admitting: Family Medicine

## 2023-01-17 DIAGNOSIS — Z1231 Encounter for screening mammogram for malignant neoplasm of breast: Secondary | ICD-10-CM

## 2023-02-15 ENCOUNTER — Ambulatory Visit
Admission: RE | Admit: 2023-02-15 | Discharge: 2023-02-15 | Disposition: A | Discharge status: Home / Self Care | Payer: Medicare Other | Source: Ambulatory Visit | Attending: Family Medicine | Admitting: Family Medicine

## 2023-02-15 DIAGNOSIS — Z1231 Encounter for screening mammogram for malignant neoplasm of breast: Secondary | ICD-10-CM | POA: Insufficient documentation

## 2023-07-11 ENCOUNTER — Other Ambulatory Visit: Payer: Self-pay | Admitting: Student in an Organized Health Care Education/Training Program

## 2023-07-11 DIAGNOSIS — E785 Hyperlipidemia, unspecified: Secondary | ICD-10-CM

## 2023-07-12 ENCOUNTER — Ambulatory Visit
Admission: RE | Admit: 2023-07-12 | Discharge: 2023-07-12 | Disposition: A | Discharge status: Home / Self Care | Payer: Medicare Other | Source: Ambulatory Visit | Attending: Student in an Organized Health Care Education/Training Program | Admitting: Student in an Organized Health Care Education/Training Program

## 2023-07-12 DIAGNOSIS — E785 Hyperlipidemia, unspecified: Secondary | ICD-10-CM | POA: Insufficient documentation

## 2024-01-02 ENCOUNTER — Other Ambulatory Visit: Payer: Self-pay | Admitting: Family Medicine

## 2024-01-02 DIAGNOSIS — Z1231 Encounter for screening mammogram for malignant neoplasm of breast: Secondary | ICD-10-CM

## 2024-02-16 ENCOUNTER — Ambulatory Visit
Admission: RE | Admit: 2024-02-16 | Discharge: 2024-02-16 | Disposition: A | Discharge status: Home / Self Care | Source: Ambulatory Visit | Attending: Family Medicine | Admitting: Family Medicine

## 2024-02-16 DIAGNOSIS — Z1231 Encounter for screening mammogram for malignant neoplasm of breast: Secondary | ICD-10-CM | POA: Diagnosis present

## 2024-05-05 IMAGING — NM NM BONE 3 PHASE
3 series · 15 of 15 positions shown · non-contrast
Comparison: None Available.

CLINICAL DATA: LEFT knee pain.  LEFT knee prosthetic

EXAM:
NUCLEAR MEDICINE 3-PHASE BONE SCAN
TECHNIQUE: Radionuclide angiographic images, immediate static blood pool
images, and 3-hour delayed static images were obtained of the knees
after intravenous injection of radiopharmaceutical.
RADIOPHARMACEUTICALS:  20.5 mCi Vc-WWm MDP IV

[Series 1000: flow · 4.80mm/px · 6 of 60 frames shown]
[frame 6/60  full-range]
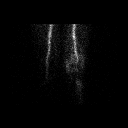
[frame 16/60  full-range]
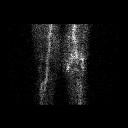
[frame 26/60  full-range]
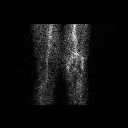
[frame 36/60  full-range]
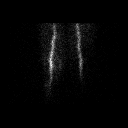
[frame 46/60  full-range]
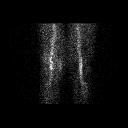
[frame 56/60  full-range]
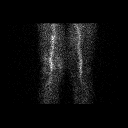

[Series 1000: immediate · 4.80mm/px · 2 of 2 frames shown]
[frame 1/2]
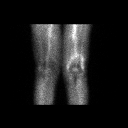
[frame 2/2]
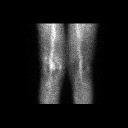

[Series 1000: delays · 4.80mm/px · 4 acquisitions, 7 frames shown]
[im 1/4]
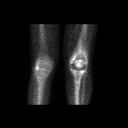
[im 2/4]
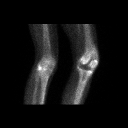
[im 2/4]
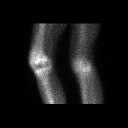
[im 3/4]
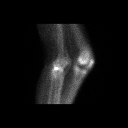
[im 3/4]
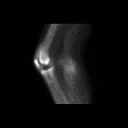
[im 4/4]
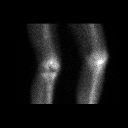
[im 4/4]
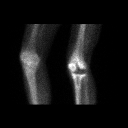

[15 of 15 positions shown; findings below may reference images not displayed]

FINDINGS: Vascular phase: There is increased blood flow to the LEFT knee
joint. Activity primarily above the knee joint

Blood pool phase: Increased blood pool activity in the LEFT knee
localizing to "capsule" of the femoral component

Delayed phase: Delayed phase imaging demonstrates a uniform uptake
within the tibial and femoral component.
IMPRESSION: 1. Increased vascular flow blood pool activity localizing to the
LEFT knee above the joint. Findings suggest capsulitis or synovitis.
2. No significant delayed phase activity to suggest loosening or
infection.
